# Patient Record
Sex: Male | Born: 1965 | Race: White | Hispanic: No | Marital: Married | Smoking: Never smoker
Health system: Southern US, Community
[De-identification: ages and names within clinical notes are randomized; demographics above are authoritative.]

## PROBLEM LIST (undated history)

## (undated) DIAGNOSIS — K5790 Diverticulosis of intestine, part unspecified, without perforation or abscess without bleeding: Secondary | ICD-10-CM

## (undated) DIAGNOSIS — E669 Obesity, unspecified: Secondary | ICD-10-CM

## (undated) DIAGNOSIS — G4730 Sleep apnea, unspecified: Secondary | ICD-10-CM

## (undated) DIAGNOSIS — Z87442 Personal history of urinary calculi: Secondary | ICD-10-CM

## (undated) DIAGNOSIS — R0600 Dyspnea, unspecified: Secondary | ICD-10-CM

## (undated) DIAGNOSIS — K649 Unspecified hemorrhoids: Secondary | ICD-10-CM

## (undated) DIAGNOSIS — K635 Polyp of colon: Secondary | ICD-10-CM

## (undated) HISTORY — PX: FRACTURE SURGERY: SHX138

---

## 2006-03-17 ENCOUNTER — Ambulatory Visit: Payer: Self-pay | Admitting: Family Medicine

## 2006-03-19 ENCOUNTER — Ambulatory Visit: Payer: Self-pay

## 2012-05-14 ENCOUNTER — Ambulatory Visit: Payer: Self-pay | Admitting: Neurology

## 2013-10-20 ENCOUNTER — Emergency Department: Payer: Self-pay | Admitting: Emergency Medicine

## 2013-10-20 LAB — BASIC METABOLIC PANEL
Anion Gap: 6 — ABNORMAL LOW (ref 7–16)
BUN: 15 mg/dL (ref 7–18)
CALCIUM: 8.8 mg/dL (ref 8.5–10.1)
CHLORIDE: 106 mmol/L (ref 98–107)
CREATININE: 1.24 mg/dL (ref 0.60–1.30)
Co2: 27 mmol/L (ref 21–32)
EGFR (Non-African Amer.): >60
GLUCOSE: 105 mg/dL — AB (ref 65–99)
Osmolality: 279 (ref 275–301)
Potassium: 4.2 mmol/L (ref 3.5–5.1)
Sodium: 139 mmol/L (ref 136–145)

## 2013-10-20 LAB — URINALYSIS, COMPLETE
Bacteria: NONE SEEN
Bilirubin,UR: NEGATIVE
GLUCOSE, UR: NEGATIVE mg/dL (ref 0–75)
KETONE: NEGATIVE
LEUKOCYTE ESTERASE: NEGATIVE
Nitrite: NEGATIVE
PH: 7 (ref 4.5–8.0)
Specific Gravity: 1.024 (ref 1.003–1.030)
Squamous Epithelial: 2
WBC UR: 1 /HPF (ref 0–5)

## 2013-10-20 LAB — CBC
HCT: 46.1 % (ref 40.0–52.0)
HGB: 15.8 g/dL (ref 13.0–18.0)
MCH: 31.7 pg (ref 26.0–34.0)
MCHC: 34.3 g/dL (ref 32.0–36.0)
MCV: 92 fL (ref 80–100)
PLATELETS: 202 10*3/uL (ref 150–440)
RBC: 4.99 10*6/uL (ref 4.40–5.90)
RDW: 13.9 % (ref 11.5–14.5)
WBC: 7.7 10*3/uL (ref 3.8–10.6)

## 2013-11-25 ENCOUNTER — Ambulatory Visit: Payer: Self-pay | Admitting: Obstetrics and Gynecology

## 2013-12-14 ENCOUNTER — Ambulatory Visit: Payer: Self-pay | Admitting: Obstetrics and Gynecology

## 2014-01-25 ENCOUNTER — Ambulatory Visit: Payer: Self-pay

## 2015-08-24 DIAGNOSIS — G4733 Obstructive sleep apnea (adult) (pediatric): Secondary | ICD-10-CM | POA: Insufficient documentation

## 2015-10-12 ENCOUNTER — Other Ambulatory Visit: Payer: Self-pay | Admitting: Family Medicine

## 2015-10-12 DIAGNOSIS — R1310 Dysphagia, unspecified: Secondary | ICD-10-CM

## 2015-10-19 ENCOUNTER — Ambulatory Visit
Admission: RE | Admit: 2015-10-19 | Discharge: 2015-10-19 | Disposition: A | Discharge status: Home / Self Care | Payer: BLUE CROSS/BLUE SHIELD | Source: Ambulatory Visit | Attending: Family Medicine | Admitting: Family Medicine

## 2015-10-19 DIAGNOSIS — K449 Diaphragmatic hernia without obstruction or gangrene: Secondary | ICD-10-CM | POA: Diagnosis not present

## 2015-10-19 DIAGNOSIS — R938 Abnormal findings on diagnostic imaging of other specified body structures: Secondary | ICD-10-CM | POA: Diagnosis not present

## 2015-10-19 DIAGNOSIS — R1310 Dysphagia, unspecified: Secondary | ICD-10-CM | POA: Diagnosis present

## 2016-01-11 ENCOUNTER — Encounter: Payer: Self-pay | Admitting: *Deleted

## 2016-01-12 ENCOUNTER — Ambulatory Visit
Admission: RE | Admit: 2016-01-12 | Discharge: 2016-01-12 | Disposition: A | Discharge status: Home / Self Care | Payer: BLUE CROSS/BLUE SHIELD | Source: Ambulatory Visit | Attending: Gastroenterology | Admitting: Gastroenterology

## 2016-01-12 ENCOUNTER — Ambulatory Visit: Payer: BLUE CROSS/BLUE SHIELD | Admitting: Certified Registered Nurse Anesthetist

## 2016-01-12 ENCOUNTER — Encounter
Admission: RE | Disposition: A | Discharge status: Home / Self Care | Payer: Self-pay | Source: Ambulatory Visit | Attending: Gastroenterology

## 2016-01-12 ENCOUNTER — Encounter: Payer: Self-pay | Admitting: Certified Registered Nurse Anesthetist

## 2016-01-12 DIAGNOSIS — K5730 Diverticulosis of large intestine without perforation or abscess without bleeding: Secondary | ICD-10-CM | POA: Diagnosis not present

## 2016-01-12 DIAGNOSIS — D123 Benign neoplasm of transverse colon: Secondary | ICD-10-CM | POA: Insufficient documentation

## 2016-01-12 DIAGNOSIS — K635 Polyp of colon: Secondary | ICD-10-CM | POA: Insufficient documentation

## 2016-01-12 DIAGNOSIS — Z1211 Encounter for screening for malignant neoplasm of colon: Secondary | ICD-10-CM | POA: Insufficient documentation

## 2016-01-12 DIAGNOSIS — K219 Gastro-esophageal reflux disease without esophagitis: Secondary | ICD-10-CM | POA: Insufficient documentation

## 2016-01-12 HISTORY — DX: Obesity, unspecified: E66.9

## 2016-01-12 HISTORY — PX: COLONOSCOPY WITH PROPOFOL: SHX5780

## 2016-01-12 SURGERY — COLONOSCOPY WITH PROPOFOL
Anesthesia: General

## 2016-01-12 MED ORDER — LIDOCAINE HCL (PF) 1 % IJ SOLN
INTRAMUSCULAR | Status: AC
  Administered 2016-01-12: 0.03 mL via INTRADERMAL
  Filled 2016-01-12: qty 2

## 2016-01-12 MED ORDER — FENTANYL CITRATE (PF) 100 MCG/2ML IJ SOLN
INTRAMUSCULAR | Status: DC | PRN
  Administered 2016-01-12: 50 ug via INTRAVENOUS

## 2016-01-12 MED ORDER — PROPOFOL 10 MG/ML IV BOLUS
INTRAVENOUS | Status: DC | PRN
  Administered 2016-01-12: 60 mg via INTRAVENOUS

## 2016-01-12 MED ORDER — PROPOFOL 500 MG/50ML IV EMUL
INTRAVENOUS | Status: DC | PRN
  Administered 2016-01-12: 160 ug/kg/min via INTRAVENOUS

## 2016-01-12 MED ORDER — SODIUM CHLORIDE 0.9 % IV SOLN
INTRAVENOUS | Status: DC
  Administered 2016-01-12: 1000 mL via INTRAVENOUS

## 2016-01-12 MED ORDER — LIDOCAINE HCL (PF) 1 % IJ SOLN
2.0000 mL | Freq: Once | INTRAMUSCULAR | Status: AC
  Administered 2016-01-12: 0.03 mL via INTRADERMAL

## 2016-01-12 MED ORDER — MIDAZOLAM HCL 5 MG/5ML IJ SOLN
INTRAMUSCULAR | Status: DC | PRN
  Administered 2016-01-12: 1 mg via INTRAVENOUS

## 2016-01-12 MED ORDER — SODIUM CHLORIDE 0.9 % IV SOLN: INTRAVENOUS | Status: DC

## 2016-01-12 NOTE — Anesthesia Preprocedure Evaluation (Signed)
Anesthesia Evaluation  Patient identified by MRN, date of birth, ID band Patient awake    Reviewed: Allergy & Precautions, NPO status , Patient's Chart, lab work & pertinent test results  History of Anesthesia Complications Negative for: history of anesthetic complications  Airway Mallampati: II       Dental   Pulmonary neg pulmonary ROS,           Cardiovascular      Neuro/Psych negative neurological ROS     GI/Hepatic Neg liver ROS, GERD  Medicated and Controlled,  Endo/Other  negative endocrine ROS  Renal/GU negative Renal ROS     Musculoskeletal negative musculoskeletal ROS (+)   Abdominal   Peds  Hematology negative hematology ROS (+)   Anesthesia Other Findings   Reproductive/Obstetrics                             Anesthesia Physical Anesthesia Plan  ASA: II  Anesthesia Plan: General   Post-op Pain Management:    Induction: Intravenous  Airway Management Planned: Nasal Cannula  Additional Equipment:   Intra-op Plan:   Post-operative Plan:   Informed Consent: I have reviewed the patients History and Physical, chart, labs and discussed the procedure including the risks, benefits and alternatives for the proposed anesthesia with the patient or authorized representative who has indicated his/her understanding and acceptance.     Plan Discussed with:   Anesthesia Plan Comments:         Anesthesia Quick Evaluation

## 2016-01-12 NOTE — Anesthesia Postprocedure Evaluation (Signed)
Anesthesia Post Note  Patient: Trevor Jacobson.  Procedure(s) Performed: Procedure(s) (LRB): COLONOSCOPY WITH PROPOFOL (N/A)  Patient location during evaluation: Endoscopy Anesthesia Type: General Level of consciousness: awake and alert Pain management: pain level controlled Vital Signs Assessment: post-procedure vital signs reviewed and stable Respiratory status: spontaneous breathing and respiratory function stable Cardiovascular status: stable Anesthetic complications: no    Last Vitals:  Vitals:   01/12/16 1407 01/12/16 1417  BP: 101/62 109/76  Pulse: 70 69  Resp: 16 18  Temp:      Last Pain:  Vitals:   01/12/16 1346  TempSrc: Tympanic                 Serina Nichter K

## 2016-01-12 NOTE — Transfer of Care (Signed)
Immediate Anesthesia Transfer of Care Note  Patient: Trevor Jacobson.  Procedure(s) Performed: Procedure(s): COLONOSCOPY WITH PROPOFOL (N/A)  Patient Location: PACU and Endoscopy Unit  Anesthesia Type:General  Level of Consciousness: awake, alert  and oriented  Airway & Oxygen Therapy: Patient Spontanous Breathing and Patient connected to nasal cannula oxygen  Post-op Assessment: Report given to RN and Post -op Vital signs reviewed and stable  Post vital signs: Reviewed and stable  Last Vitals:  Vitals:   01/12/16 1220 01/12/16 1346  BP: 128/74   Pulse: 82   Resp: 17   Temp:  36.3 C    Last Pain:  Vitals:   01/12/16 1346  TempSrc: Tympanic         Complications: No apparent anesthesia complications

## 2016-01-12 NOTE — H&P (Signed)
Outpatient short stay form Pre-procedure 01/12/2016 12:34 PM Lollie Sails MD  Primary Physician: Dr. [place] Pink  Reason for visit:  Colonoscopy  History of present illness:  Patient is a 50 year old male presenting today as above. He tolerated his prep well. He takes no aspirin or blood thinning agents. This is his first colonoscopy.    Current Facility-Administered Medications:  .  0.9 %  sodium chloride infusion, , Intravenous, Continuous, Lollie Sails, MD, Last Rate: 20 mL/hr at 01/12/16 1232, 1,000 mL at 01/12/16 1232 .  0.9 %  sodium chloride infusion, , Intravenous, Continuous, Lollie Sails, MD  Prescriptions Prior to Admission  Medication Sig Dispense Refill Last Dose  . omeprazole (PRILOSEC) 20 MG capsule Take 20 mg by mouth daily.        No Known Allergies   Past Medical History:  Diagnosis Date  . Obese     Review of systems:      Physical Exam    Heart and lungs: Regular rate and rhythm without rub or gallop, lungs are bilaterally clear.    HEENT: Normocephalic atraumatic eyes are anicteric    Other:     Pertinant exam for procedure: Protuberant, soft, nontender nondistended bowel sounds positive normoactive.    Planned proceedures: Colonoscopy and indicated procedures.    Lollie Sails, MD Gastroenterology 01/12/2016  12:34 PM

## 2016-01-12 NOTE — Op Note (Signed)
[facility] Gastroenterology Patient Name: Trevor Jacobson Procedure Date: 01/12/2016 12:27 PM MRN: [id number] Account #: [id number] Date of Birth: 07-13-1965 Admit Type: Outpatient Age: 50 Room: [facility] ENDO ROOM 3 Gender: Male Note Status: Finalized Procedure:            Colonoscopy Indications:          Screening for colorectal malignant neoplasm, This is                        the patient's first colonoscopy Providers:            Lollie Sails, MD Referring MD:         Irven Easterly. Kary Kos, MD (Referring MD) Medicines:            Monitored Anesthesia Care Complications:        No immediate complications. Procedure:            Pre-Anesthesia Assessment:                       - ASA Grade Assessment: III - A patient with severe                        systemic disease.                       After obtaining informed consent, the colonoscope was                        passed under direct vision. Throughout the procedure,                        the patient's blood pressure, pulse, and oxygen                        saturations were monitored continuously. The                        Colonoscope was introduced through the anus and                        advanced to the the ileocecal valve. The patient                        tolerated the procedure well. The quality of the bowel                        preparation was good. Findings:      A 2 mm polyp was found in the sigmoid colon. The polyp was sessile. The       polyp was removed with a cold biopsy forceps. Resection and retrieval       were complete.      A 12 mm polyp was found in the hepatic flexure. The polyp was sessile.       The polyp was removed with a cold snare. The polyp was removed with a       lift and cut technique using a cold snare. The polyp was removed with a       piecemeal technique using a cold snare. Resection and retrieval were       complete.      A few small-mouthed diverticula were found in  the sigmoid colon and       descending colon.      Despite multiple efforts including abdominal support and position       change, I was unable to intubate the very base of the cecum. Most of the       cecum was seen from just distal to the IC valve, and noted to be normal.      The retroflexed view of the distal rectum and anal verge was normal and       showed no anal or rectal abnormalities.      The digital rectal exam was normal. Impression:           - One 2 mm polyp in the sigmoid colon, removed with a                        cold biopsy forceps. Resected and retrieved.                       - One 12 mm polyp at the hepatic flexure, removed with                        a cold snare, removed using lift and cut and a cold                        snare and removed piecemeal using a cold snare.                        Resected and retrieved.                       - Diverticulosis in the sigmoid colon and in the                        descending colon. Recommendation:       - Discharge patient to home. Procedure Code(s):    --- Professional ---                       [phone], Colonoscopy, flexible; with removal of tumor(s),                        polyp(s), or other lesion(s) by snare technique                       45380, 40, Colonoscopy, flexible; with biopsy, single                        or multiple Diagnosis Code(s):    --- Professional ---                       Z12.11, Encounter for screening for malignant neoplasm                        of colon                       D12.5, Benign neoplasm of sigmoid colon                       D12.3, Benign neoplasm of transverse colon (hepatic  flexure or splenic flexure)                       K57.30, Diverticulosis of large intestine without                        perforation or abscess without bleeding CPT copyright 2016 American Medical Association. All rights reserved. The codes documented in this report are preliminary and  upon coder review may  be revised to meet current compliance requirements. Lollie Sails, MD 01/12/2016 1:44:06 PM This report has been signed electronically. Number of Addenda: 0 Note Initiated On: 01/12/2016 12:27 PM Scope Withdrawal Time: 0 hours 9 minutes 50 seconds  Total Procedure Duration: 0 hours 49 minutes 25 seconds       [facility]

## 2016-01-12 NOTE — Anesthesia Procedure Notes (Signed)
Date/Time: 01/12/2016 12:43 PM Performed by: Kennon Holter Pre-anesthesia Checklist: Timeout performed, Patient being monitored, Suction available, Emergency Drugs available and Patient identified Patient Re-evaluated:Patient Re-evaluated prior to inductionOxygen Delivery Method: Nasal cannula Preoxygenation: Pre-oxygenation with 100% oxygen Placement Confirmation: positive ETCO2

## 2016-01-15 ENCOUNTER — Encounter: Payer: Self-pay | Admitting: Gastroenterology

## 2016-01-15 LAB — SURGICAL PATHOLOGY

## 2016-03-30 IMAGING — US US RENAL KIDNEY
1 series · 14 of 25 positions shown · non-contrast
Comparison: None.

CLINICAL DATA: 48-year-old male with right flank pain. Recent
kidney stones. Initial encounter.

EXAM:
RENAL/URINARY TRACT ULTRASOUND COMPLETE

[Series 1: us renal kidney · 0.27mm/px · 14 of 46 slices shown]
[im 1/46]
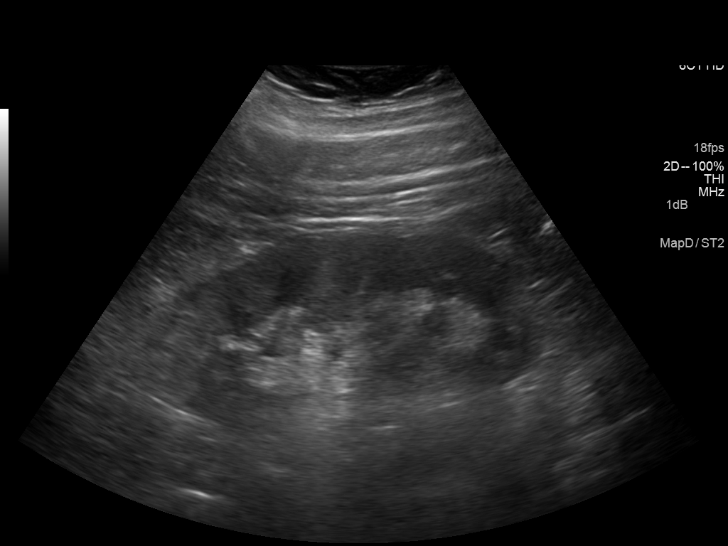
[im 4/46]
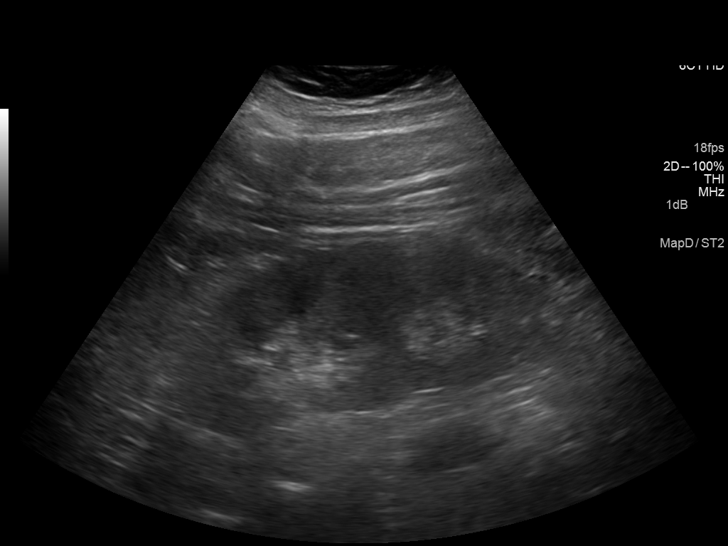
[im 8/46]
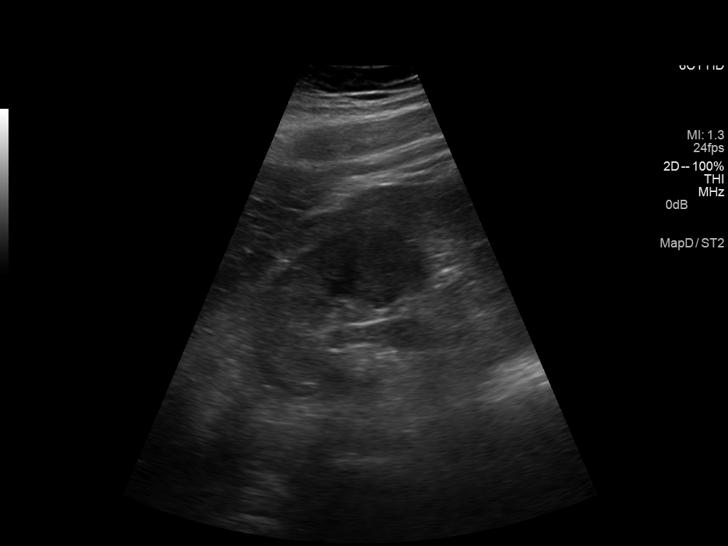
[im 12/46]
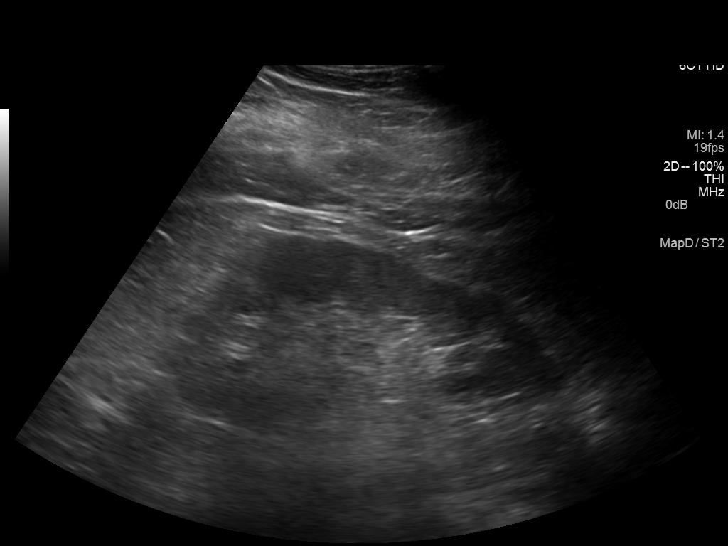
[im 16/46]
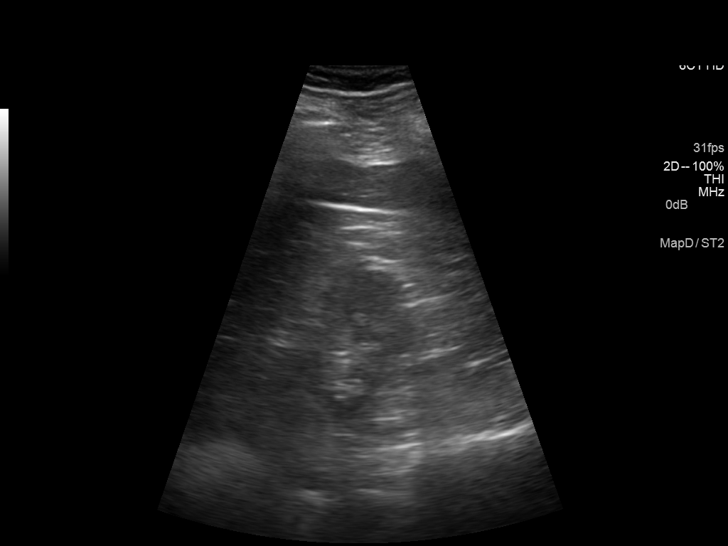
[im 17/46]
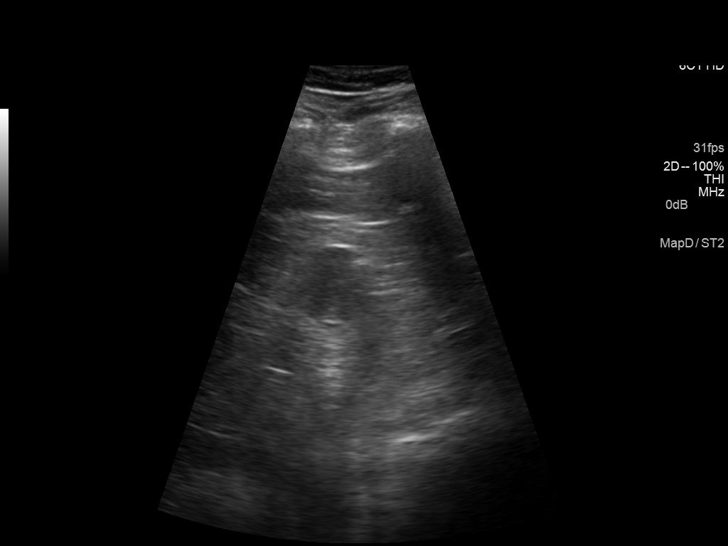
[im 21/46]
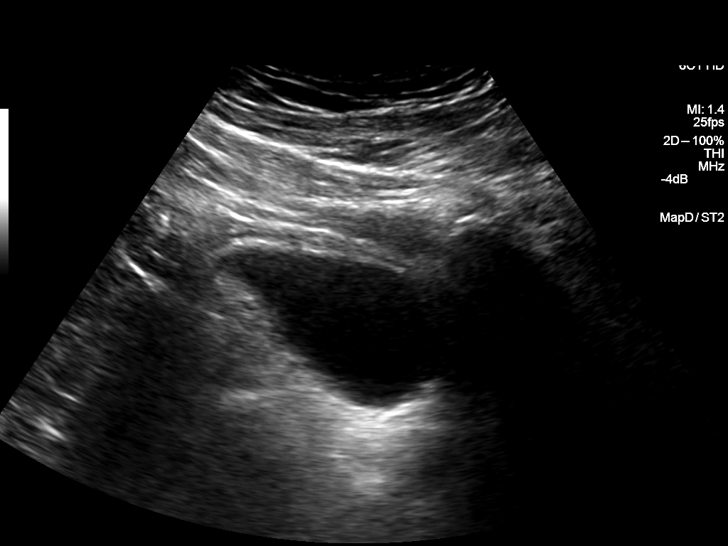
[im 25/46]
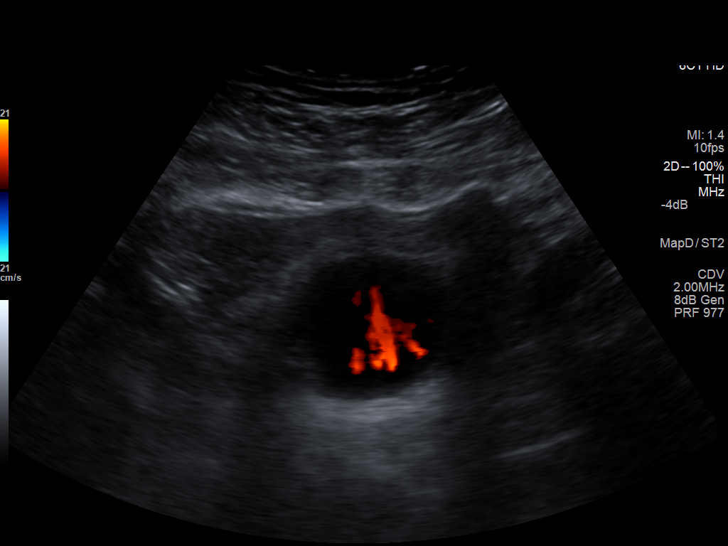
[im 29/46]
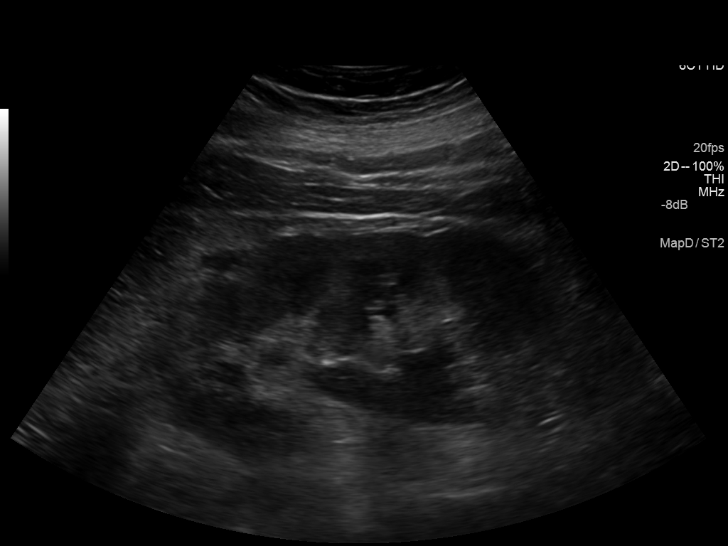
[im 31/46]
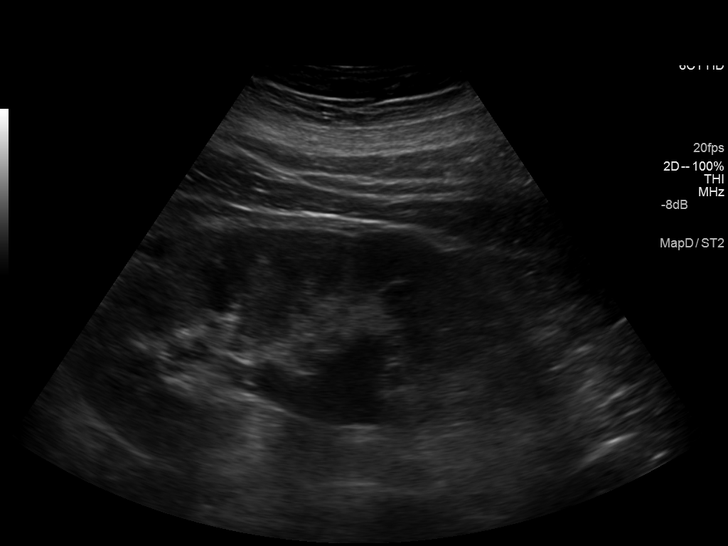
[im 34/46]
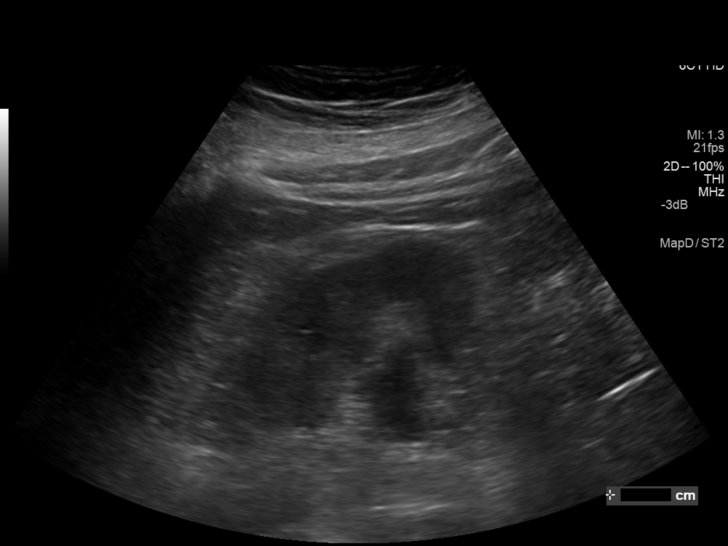
[im 38/46]
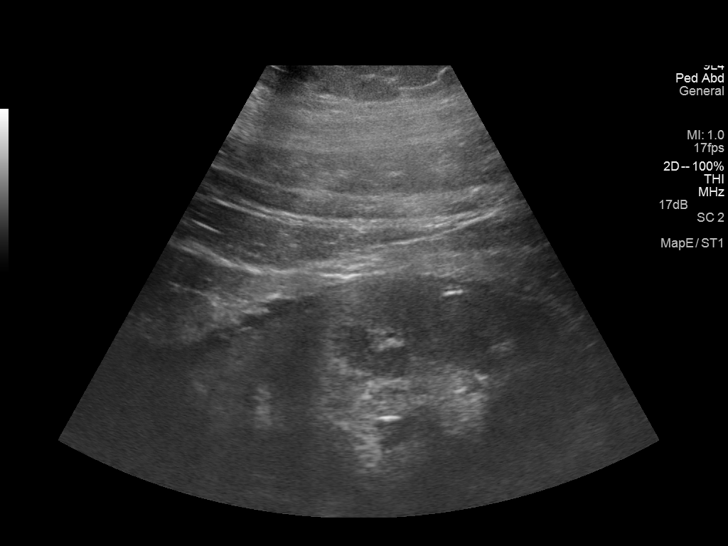
[im 42/46]
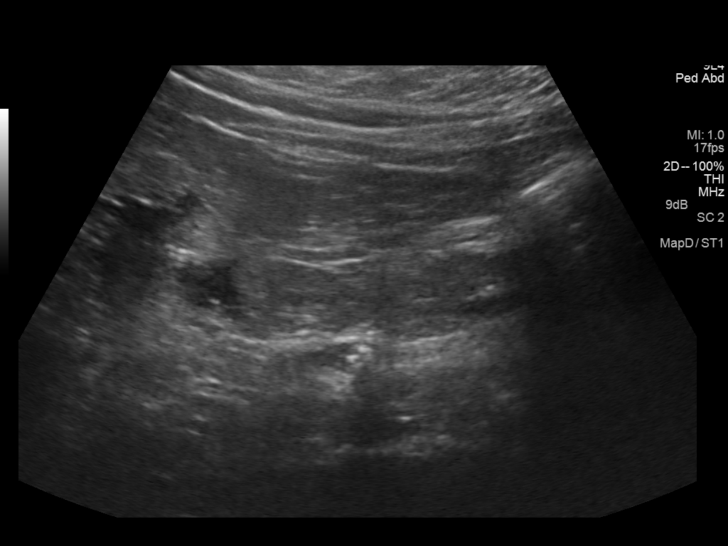
[im 46/46]
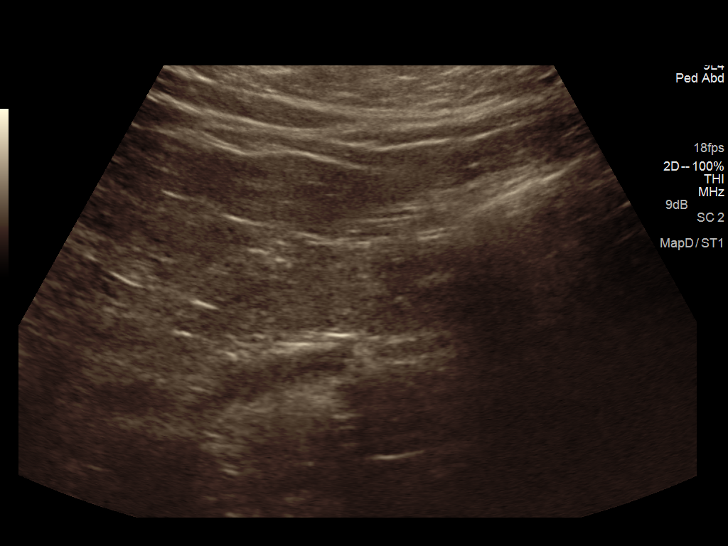

[14 of 25 positions shown; findings below may reference images not displayed]

FINDINGS: Right Kidney:

Length: 15.0 cm. Trace para renal free fluid (image 28). There is
enlargement of the right renal pelvis, less enlargement of the intra
renal collecting system (image 32). The right ureter can be followed
to an intermediate echogenicity lesion seen on image 42 measuring 5
x 6 mm. Renal corticomedullary differentiation preserved. No intra
renal lesion identified.

Left Kidney:

Length: 13.1 cm. Echogenicity within normal limits. No mass or
hydronephrosis visualized.

Bladder:

Diminutive, appears normal. Both ureteral jets are detected with
Doppler, although the left appears more robust (image 26).
IMPRESSION: 1. Suspect acute obstructive uropathy on the right with evidence of
a 5 x 6 mm proximal right ureteral calculus (image 42).
2. Negative sonographic appearance of the left kidney and bladder.

## 2016-09-19 DIAGNOSIS — Z0000 Encounter for general adult medical examination without abnormal findings: Secondary | ICD-10-CM | POA: Diagnosis not present

## 2016-09-19 DIAGNOSIS — G4733 Obstructive sleep apnea (adult) (pediatric): Secondary | ICD-10-CM | POA: Diagnosis not present

## 2016-09-19 DIAGNOSIS — Z7689 Persons encountering health services in other specified circumstances: Secondary | ICD-10-CM | POA: Diagnosis not present

## 2016-09-19 DIAGNOSIS — Z125 Encounter for screening for malignant neoplasm of prostate: Secondary | ICD-10-CM | POA: Diagnosis not present

## 2016-12-11 DIAGNOSIS — R079 Chest pain, unspecified: Secondary | ICD-10-CM | POA: Diagnosis not present

## 2016-12-11 DIAGNOSIS — R0602 Shortness of breath: Secondary | ICD-10-CM | POA: Insufficient documentation

## 2016-12-11 DIAGNOSIS — R42 Dizziness and giddiness: Secondary | ICD-10-CM | POA: Diagnosis not present

## 2016-12-18 DIAGNOSIS — R002 Palpitations: Secondary | ICD-10-CM | POA: Diagnosis not present

## 2016-12-26 DIAGNOSIS — H018 Other specified inflammations of eyelid: Secondary | ICD-10-CM | POA: Diagnosis not present

## 2016-12-28 DIAGNOSIS — R0602 Shortness of breath: Secondary | ICD-10-CM | POA: Diagnosis not present

## 2016-12-28 DIAGNOSIS — G4733 Obstructive sleep apnea (adult) (pediatric): Secondary | ICD-10-CM | POA: Diagnosis not present

## 2016-12-29 DIAGNOSIS — R0602 Shortness of breath: Secondary | ICD-10-CM | POA: Diagnosis not present

## 2016-12-29 DIAGNOSIS — G4733 Obstructive sleep apnea (adult) (pediatric): Secondary | ICD-10-CM | POA: Diagnosis not present

## 2017-01-09 DIAGNOSIS — R079 Chest pain, unspecified: Secondary | ICD-10-CM | POA: Diagnosis not present

## 2017-01-09 DIAGNOSIS — R0602 Shortness of breath: Secondary | ICD-10-CM | POA: Diagnosis not present

## 2017-01-14 DIAGNOSIS — R079 Chest pain, unspecified: Secondary | ICD-10-CM | POA: Diagnosis not present

## 2017-01-14 DIAGNOSIS — G4733 Obstructive sleep apnea (adult) (pediatric): Secondary | ICD-10-CM | POA: Diagnosis not present

## 2017-01-14 DIAGNOSIS — R0602 Shortness of breath: Secondary | ICD-10-CM | POA: Diagnosis not present

## 2017-01-16 DIAGNOSIS — G4733 Obstructive sleep apnea (adult) (pediatric): Secondary | ICD-10-CM | POA: Diagnosis not present

## 2017-02-15 DIAGNOSIS — G4733 Obstructive sleep apnea (adult) (pediatric): Secondary | ICD-10-CM | POA: Diagnosis not present

## 2017-03-18 DIAGNOSIS — G4733 Obstructive sleep apnea (adult) (pediatric): Secondary | ICD-10-CM | POA: Diagnosis not present

## 2017-03-19 DIAGNOSIS — G4733 Obstructive sleep apnea (adult) (pediatric): Secondary | ICD-10-CM | POA: Diagnosis not present

## 2017-03-22 ENCOUNTER — Emergency Department
Admission: EM | Admit: 2017-03-22 | Discharge: 2017-03-22 | Disposition: A | Discharge status: Home / Self Care | Payer: 59 | Attending: Emergency Medicine | Admitting: Emergency Medicine

## 2017-03-22 ENCOUNTER — Encounter: Payer: Self-pay | Admitting: Emergency Medicine

## 2017-03-22 ENCOUNTER — Other Ambulatory Visit: Payer: Self-pay

## 2017-03-22 DIAGNOSIS — S61411A Laceration without foreign body of right hand, initial encounter: Secondary | ICD-10-CM

## 2017-03-22 DIAGNOSIS — Y929 Unspecified place or not applicable: Secondary | ICD-10-CM | POA: Insufficient documentation

## 2017-03-22 DIAGNOSIS — Y939 Activity, unspecified: Secondary | ICD-10-CM | POA: Diagnosis not present

## 2017-03-22 DIAGNOSIS — Z23 Encounter for immunization: Secondary | ICD-10-CM | POA: Diagnosis not present

## 2017-03-22 DIAGNOSIS — Z79899 Other long term (current) drug therapy: Secondary | ICD-10-CM | POA: Insufficient documentation

## 2017-03-22 DIAGNOSIS — W268XXA Contact with other sharp object(s), not elsewhere classified, initial encounter: Secondary | ICD-10-CM | POA: Insufficient documentation

## 2017-03-22 DIAGNOSIS — Y998 Other external cause status: Secondary | ICD-10-CM | POA: Insufficient documentation

## 2017-03-22 MED ORDER — TETANUS-DIPHTH-ACELL PERTUSSIS 5-2.5-18.5 LF-MCG/0.5 IM SUSP
0.5000 mL | Freq: Once | INTRAMUSCULAR | Status: AC
  Administered 2017-03-22: 0.5 mL via INTRAMUSCULAR
  Filled 2017-03-22: qty 0.5

## 2017-03-22 NOTE — ED Triage Notes (Signed)
Pt cut between 4th and 5th digit with box cutter.  Bleeding controlled. Tdap not UTD.

## 2017-03-22 NOTE — ED Notes (Signed)
See triage note  States he was using a box cutter today while trying floor    The box cutter slipped and cut hand  Laceration noted between left 4th and 5 th fingers  Bleeding controlled at present

## 2017-03-22 NOTE — ED Provider Notes (Signed)
[facility] Emergency Department Provider Note  ____________________________________________  Time seen: Approximately 1:23 PM  I have reviewed the triage vital signs and the nursing notes.   HISTORY  Chief Complaint Laceration    HPI Trevor Jacobson. is a 51 y.o. male who presents the emergency department complaining of a laceration between the fourth and fifth digits of the right hand.  Patient was trying to open something with a box cutter when the blade slipped and he punctured between the interdigital space.  Patient denies any loss of range of motion.  Bleeding was easily controlled with direct pressure.  No other injury or complaint.  Patient's tetanus shot is not up-to-date.  Past Medical History:  Diagnosis Date  . Obese     There are no active problems to display for this patient.   Past Surgical History:  Procedure Laterality Date  . COLONOSCOPY WITH PROPOFOL N/A 01/12/2016   Procedure: COLONOSCOPY WITH PROPOFOL;  Surgeon: Lollie Sails, MD;  Location: [facility] ENDOSCOPY;  Service: Endoscopy;  Laterality: N/A;    Prior to Admission medications   Medication Sig Start Date End Date Taking? Authorizing Provider  omeprazole (PRILOSEC) 20 MG capsule Take 20 mg by mouth daily.    [provider]    Allergies Patient has no known allergies.  History reviewed. No pertinent family history.  Social History Social History   Tobacco Use  . Smoking status: Never Smoker  . Smokeless tobacco: Never Used  Substance Use Topics  . Alcohol use: Yes  . Drug use: No     Review of Systems  Constitutional: No fever/chills Eyes: No visual changes.  Respiratory: no cough. No SOB. Gastrointestinal: No abdominal pain.  No nausea, no vomiting.   Musculoskeletal: Negative for musculoskeletal pain. Skin: Positive for laceration between the fourth and fifth digits of the right hand. Neurological: Negative for headaches, focal weakness or  numbness. 10-point ROS otherwise negative.  ____________________________________________   PHYSICAL EXAM:  VITAL SIGNS: ED Triage Vitals [03/22/17 1304]  Enc Vitals Group     BP 136/85     Pulse Rate 82     Resp 16     Temp 98.4 F (36.9 C)     Temp Source Oral     SpO2 98 %     Weight 279 lb (126.6 kg)     Height 5\' 10"  (1.778 m)     Head Circumference      Peak Flow      Pain Score 4     Pain Loc      Pain Edu?      Excl. in [place]?      Constitutional: Alert and oriented. Well appearing and in no acute distress. Eyes: Conjunctivae are normal. PERRL. EOMI. Head: Atraumatic. Neck: No stridor.    Cardiovascular: Normal rate, regular rhythm. Normal S1 and S2.  Good peripheral circulation. Respiratory: Normal respiratory effort without tachypnea or retractions. Lungs CTAB. Good air entry to the bases with no decreased or absent breath sounds. Musculoskeletal: Full range of motion to all extremities. No gross deformities appreciated. Neurologic:  Normal speech and language. No gross focal neurologic deficits are appreciated.  Skin:  Skin is warm, dry and intact. No rash noted.  Small 0.5 cm puncture wound noted between the fourth and fifth digits of the right hand.  Edges are smooth in nature.  No foreign body.  No bleeding.  Full range of motion to fourth and fifth digits of the right hand.  Sensation and cap refill intact to the fourth and fifth digits. Psychiatric: Mood and affect are normal. Speech and behavior are normal. Patient exhibits appropriate insight and judgement.   ____________________________________________   LABS (all labs ordered are listed, but only abnormal results are displayed)  Labs Reviewed - No data to display ____________________________________________  EKG   ____________________________________________  RADIOLOGY   No results found.  ____________________________________________    PROCEDURES  Procedure(s) performed:     Marland KitchenMarland KitchenLaceration Repair Date/Time: 03/22/2017 1:47 PM Performed by: Darletta Moll, PA-C Authorized by: Darletta Moll, PA-C   Consent:    Consent obtained:  Verbal   Consent given by:  Patient   Risks discussed:  Pain and poor wound healing Anesthesia (see MAR for exact dosages):    Anesthesia method:  None Laceration details:    Location:  Hand   Hand location: Interdigital space between the fourth and fifth digits.   Length (cm):  0.5 Repair type:    Repair type:  Simple Exploration:    Hemostasis achieved with:  Direct pressure   Wound exploration: wound explored through full range of motion and entire depth of wound probed and visualized     Wound extent: no foreign bodies/material noted, no muscle damage noted, no nerve damage noted, no tendon damage noted, no underlying fracture noted and no vascular damage noted     Contaminated: no   Treatment:    Area cleansed with:  Betadine   Amount of cleaning:  Extensive Skin repair:    Repair method:  Tissue adhesive Approximation:    Approximation:  Close Post-procedure details:    Dressing:  Splint for protection   Patient tolerance of procedure:  Tolerated well, no immediate complications      Medications  Tdap (BOOSTRIX) injection 0.5 mL (not administered)     ____________________________________________   INITIAL IMPRESSION / ASSESSMENT AND PLAN / ED COURSE  Pertinent labs & imaging results that were available during my care of the patient were reviewed by me and considered in my medical decision making (see chart for details).  Review of the [place] CSRS was performed in accordance of the [place] prior to dispensing any controlled drugs.     Patient's diagnosis is consistent with an laceration.  Patient had puncture wound between the fourth and fifth digits in the interdigital space.  Area sealed using Dermabond.  Splint applied for protection.  Tetanus shot updated at this time.  No prescriptions at  this time.  Patient will follow up with primary care as needed..Patient is given ED precautions to return to the ED for any worsening or new symptoms.     ____________________________________________  FINAL CLINICAL IMPRESSION(S) / ED DIAGNOSES  Final diagnoses:  Laceration of right hand without foreign body, initial encounter      NEW MEDICATIONS STARTED DURING THIS VISIT:  ED Discharge Orders    None          This chart was dictated using voice recognition software/Dragon. Despite best efforts to proofread, errors can occur which can change the meaning. Any change was purely unintentional.    Darletta Moll, PA-C 03/22/17 1410    Arta Silence, MD 03/22/17 1534

## 2017-04-17 DIAGNOSIS — G4733 Obstructive sleep apnea (adult) (pediatric): Secondary | ICD-10-CM | POA: Diagnosis not present

## 2017-05-18 DIAGNOSIS — G4733 Obstructive sleep apnea (adult) (pediatric): Secondary | ICD-10-CM | POA: Diagnosis not present

## 2017-06-18 DIAGNOSIS — G4733 Obstructive sleep apnea (adult) (pediatric): Secondary | ICD-10-CM | POA: Diagnosis not present

## 2017-07-14 DIAGNOSIS — R0602 Shortness of breath: Secondary | ICD-10-CM | POA: Diagnosis not present

## 2017-07-14 DIAGNOSIS — G4733 Obstructive sleep apnea (adult) (pediatric): Secondary | ICD-10-CM | POA: Diagnosis not present

## 2017-07-14 DIAGNOSIS — R079 Chest pain, unspecified: Secondary | ICD-10-CM | POA: Diagnosis not present

## 2017-07-16 DIAGNOSIS — G4733 Obstructive sleep apnea (adult) (pediatric): Secondary | ICD-10-CM | POA: Diagnosis not present

## 2017-08-16 DIAGNOSIS — G4733 Obstructive sleep apnea (adult) (pediatric): Secondary | ICD-10-CM | POA: Diagnosis not present

## 2017-09-15 DIAGNOSIS — G4733 Obstructive sleep apnea (adult) (pediatric): Secondary | ICD-10-CM | POA: Diagnosis not present

## 2017-09-16 DIAGNOSIS — Z0000 Encounter for general adult medical examination without abnormal findings: Secondary | ICD-10-CM | POA: Diagnosis not present

## 2017-09-16 DIAGNOSIS — Z125 Encounter for screening for malignant neoplasm of prostate: Secondary | ICD-10-CM | POA: Diagnosis not present

## 2017-09-22 DIAGNOSIS — Z0000 Encounter for general adult medical examination without abnormal findings: Secondary | ICD-10-CM | POA: Diagnosis not present

## 2017-09-22 DIAGNOSIS — G4733 Obstructive sleep apnea (adult) (pediatric): Secondary | ICD-10-CM | POA: Diagnosis not present

## 2017-09-22 DIAGNOSIS — Z9989 Dependence on other enabling machines and devices: Secondary | ICD-10-CM | POA: Diagnosis not present

## 2017-10-16 DIAGNOSIS — G4733 Obstructive sleep apnea (adult) (pediatric): Secondary | ICD-10-CM | POA: Diagnosis not present

## 2018-01-15 DIAGNOSIS — G4733 Obstructive sleep apnea (adult) (pediatric): Secondary | ICD-10-CM | POA: Diagnosis not present

## 2018-01-15 DIAGNOSIS — R0602 Shortness of breath: Secondary | ICD-10-CM | POA: Diagnosis not present

## 2018-01-15 DIAGNOSIS — R079 Chest pain, unspecified: Secondary | ICD-10-CM | POA: Diagnosis not present

## 2018-07-03 DIAGNOSIS — J101 Influenza due to other identified influenza virus with other respiratory manifestations: Secondary | ICD-10-CM | POA: Diagnosis not present

## 2018-07-03 DIAGNOSIS — R6889 Other general symptoms and signs: Secondary | ICD-10-CM | POA: Diagnosis not present

## 2019-01-14 ENCOUNTER — Other Ambulatory Visit: Payer: Self-pay

## 2019-01-14 ENCOUNTER — Other Ambulatory Visit
Admission: RE | Admit: 2019-01-14 | Discharge: 2019-01-14 | Disposition: A | Discharge status: Home / Self Care | Payer: 59 | Source: Ambulatory Visit | Attending: Gastroenterology | Admitting: Gastroenterology

## 2019-01-14 DIAGNOSIS — Z20828 Contact with and (suspected) exposure to other viral communicable diseases: Secondary | ICD-10-CM | POA: Diagnosis not present

## 2019-01-14 DIAGNOSIS — Z01812 Encounter for preprocedural laboratory examination: Secondary | ICD-10-CM | POA: Diagnosis present

## 2019-01-15 ENCOUNTER — Encounter: Payer: Self-pay | Admitting: *Deleted

## 2019-01-15 LAB — SARS CORONAVIRUS 2 (TAT 6-24 HRS): SARS Coronavirus 2: NEGATIVE

## 2019-01-18 ENCOUNTER — Encounter: Payer: Self-pay | Admitting: *Deleted

## 2019-01-18 ENCOUNTER — Encounter
Admission: RE | Disposition: A | Discharge status: Home / Self Care | Payer: Self-pay | Source: Home / Self Care | Attending: Gastroenterology

## 2019-01-18 ENCOUNTER — Ambulatory Visit: Payer: 59 | Admitting: Anesthesiology

## 2019-01-18 ENCOUNTER — Other Ambulatory Visit: Payer: Self-pay

## 2019-01-18 ENCOUNTER — Ambulatory Visit
Admission: RE | Admit: 2019-01-18 | Discharge: 2019-01-18 | Disposition: A | Discharge status: Home / Self Care | Payer: 59 | Attending: Gastroenterology | Admitting: Gastroenterology

## 2019-01-18 DIAGNOSIS — Z1211 Encounter for screening for malignant neoplasm of colon: Secondary | ICD-10-CM | POA: Diagnosis not present

## 2019-01-18 DIAGNOSIS — K621 Rectal polyp: Secondary | ICD-10-CM | POA: Diagnosis not present

## 2019-01-18 DIAGNOSIS — G4730 Sleep apnea, unspecified: Secondary | ICD-10-CM | POA: Insufficient documentation

## 2019-01-18 DIAGNOSIS — Z79899 Other long term (current) drug therapy: Secondary | ICD-10-CM | POA: Insufficient documentation

## 2019-01-18 DIAGNOSIS — Z6841 Body Mass Index (BMI) 40.0 and over, adult: Secondary | ICD-10-CM | POA: Diagnosis not present

## 2019-01-18 DIAGNOSIS — Z87442 Personal history of urinary calculi: Secondary | ICD-10-CM | POA: Diagnosis not present

## 2019-01-18 DIAGNOSIS — K5730 Diverticulosis of large intestine without perforation or abscess without bleeding: Secondary | ICD-10-CM | POA: Diagnosis not present

## 2019-01-18 DIAGNOSIS — Z86010 Personal history of colonic polyps: Secondary | ICD-10-CM | POA: Diagnosis present

## 2019-01-18 DIAGNOSIS — K635 Polyp of colon: Secondary | ICD-10-CM | POA: Insufficient documentation

## 2019-01-18 DIAGNOSIS — K219 Gastro-esophageal reflux disease without esophagitis: Secondary | ICD-10-CM | POA: Insufficient documentation

## 2019-01-18 HISTORY — DX: Unspecified hemorrhoids: K64.9

## 2019-01-18 HISTORY — DX: Personal history of urinary calculi: Z87.442

## 2019-01-18 HISTORY — PX: COLONOSCOPY WITH PROPOFOL: SHX5780

## 2019-01-18 HISTORY — DX: Sleep apnea, unspecified: G47.30

## 2019-01-18 HISTORY — DX: Dyspnea, unspecified: R06.00

## 2019-01-18 HISTORY — DX: Diverticulosis of intestine, part unspecified, without perforation or abscess without bleeding: K57.90

## 2019-01-18 HISTORY — DX: Polyp of colon: K63.5

## 2019-01-18 SURGERY — COLONOSCOPY WITH PROPOFOL
Anesthesia: General

## 2019-01-18 MED ORDER — PHENYLEPHRINE HCL (PRESSORS) 10 MG/ML IV SOLN
INTRAVENOUS | Status: AC
  Filled 2019-01-18: qty 1

## 2019-01-18 MED ORDER — PROPOFOL 10 MG/ML IV BOLUS
INTRAVENOUS | Status: DC | PRN
  Administered 2019-01-18: 30 mg via INTRAVENOUS
  Administered 2019-01-18: 40 mg via INTRAVENOUS
  Administered 2019-01-18 (×3): 30 mg via INTRAVENOUS

## 2019-01-18 MED ORDER — PROPOFOL 10 MG/ML IV BOLUS
INTRAVENOUS | Status: AC
  Filled 2019-01-18: qty 20

## 2019-01-18 MED ORDER — EPHEDRINE SULFATE 50 MG/ML IJ SOLN
INTRAMUSCULAR | Status: DC | PRN
  Administered 2019-01-18: 10 mg via INTRAVENOUS

## 2019-01-18 MED ORDER — PHENYLEPHRINE HCL (PRESSORS) 10 MG/ML IV SOLN
INTRAVENOUS | Status: DC | PRN
  Administered 2019-01-18: 100 ug via INTRAVENOUS

## 2019-01-18 MED ORDER — LIDOCAINE HCL (PF) 2 % IJ SOLN
INTRAMUSCULAR | Status: AC
  Filled 2019-01-18: qty 10

## 2019-01-18 MED ORDER — LIDOCAINE HCL (CARDIAC) PF 100 MG/5ML IV SOSY
PREFILLED_SYRINGE | INTRAVENOUS | Status: DC | PRN
  Administered 2019-01-18: 60 mg via INTRAVENOUS

## 2019-01-18 MED ORDER — PROPOFOL 500 MG/50ML IV EMUL
INTRAVENOUS | Status: DC | PRN
  Administered 2019-01-18: 100 ug/kg/min via INTRAVENOUS

## 2019-01-18 MED ORDER — SODIUM CHLORIDE 0.9 % IV SOLN
INTRAVENOUS | Status: DC
  Administered 2019-01-18: 07:00:00 via INTRAVENOUS

## 2019-01-18 MED ORDER — EPHEDRINE SULFATE 50 MG/ML IJ SOLN
INTRAMUSCULAR | Status: AC
  Filled 2019-01-18: qty 1

## 2019-01-18 MED ORDER — SODIUM CHLORIDE (PF) 0.9 % IJ SOLN
INTRAMUSCULAR | Status: AC
  Filled 2019-01-18: qty 10

## 2019-01-18 MED ORDER — PROPOFOL 500 MG/50ML IV EMUL
INTRAVENOUS | Status: AC
  Filled 2019-01-18: qty 50

## 2019-01-18 NOTE — Anesthesia Postprocedure Evaluation (Signed)
Anesthesia Post Note  Patient: Trevor Jacobson.  Procedure(s) Performed: COLONOSCOPY WITH PROPOFOL (N/A )  Patient location during evaluation: Endoscopy Anesthesia Type: General Level of consciousness: awake and alert Pain management: pain level controlled Vital Signs Assessment: post-procedure vital signs reviewed and stable Respiratory status: spontaneous breathing and respiratory function stable Cardiovascular status: stable Anesthetic complications: no     Last Vitals:  Vitals:   01/18/19 0840 01/18/19 0843  BP: 122/77 122/77  Pulse: 83 82  Resp: 16 15  Temp: (!) 35.8 C   SpO2: 98% 97%    Last Pain:  Vitals:   01/18/19 0840  TempSrc: Tympanic                 KEPHART,WILLIAM K

## 2019-01-18 NOTE — Anesthesia Preprocedure Evaluation (Signed)
Anesthesia Evaluation  Patient identified by MRN, date of birth, ID band Patient awake    Reviewed: Allergy & Precautions, NPO status , Patient's Chart, lab work & pertinent test results  History of Anesthesia Complications Negative for: history of anesthetic complications  Airway Mallampati: III       Dental   Pulmonary sleep apnea and Continuous Positive Airway Pressure Ventilation , neg COPD, Not current smoker,           Cardiovascular (-) hypertension(-) Past MI and (-) CHF (-) dysrhythmias (-) Valvular Problems/Murmurs     Neuro/Psych neg Seizures    GI/Hepatic Neg liver ROS, GERD  Medicated and Controlled,  Endo/Other  neg diabetesMorbid obesity  Renal/GU negative Renal ROS     Musculoskeletal   Abdominal   Peds  Hematology   Anesthesia Other Findings   Reproductive/Obstetrics                             Anesthesia Physical Anesthesia Plan  ASA: III  Anesthesia Plan: General   Post-op Pain Management:    Induction: Intravenous  PONV Risk Score and Plan: 2 and Propofol infusion and TIVA  Airway Management Planned: Nasal Cannula  Additional Equipment:   Intra-op Plan:   Post-operative Plan:   Informed Consent: I have reviewed the patients History and Physical, chart, labs and discussed the procedure including the risks, benefits and alternatives for the proposed anesthesia with the patient or authorized representative who has indicated his/her understanding and acceptance.       Plan Discussed with:   Anesthesia Plan Comments:         Anesthesia Quick Evaluation

## 2019-01-18 NOTE — H&P (Signed)
Outpatient short stay form Pre-procedure 01/18/2019 7:44 AM Trevor Sails MD  Primary Physician: Dr. [place] Pink  Reason for visit: Colonoscopy  History of present illness: Patient is a 53 year old male presenting today for colonoscopy in regards to his personal history of adenomatous colon polyp.  His last colonoscopy was 01/12/2016 with a 12 mm polyp being removed from the hepatic flexure.  He tolerated his prep well.  He takes no aspirin or blood thinning agent.  Patient had a episode of chest pain over a year ago.  This is not been repeated.  He is not needed to take any anti-anginal agent.    Current Facility-Administered Medications:  .  0.9 %  sodium chloride infusion, , Intravenous, Continuous, Trevor Sails, MD, Last Rate: 20 mL/hr at 01/18/19 [id number]  Medications Prior to Admission  Medication Sig Dispense Refill Last Dose  . omeprazole (PRILOSEC) 20 MG capsule Take 20 mg by mouth daily.   Past Week at Unknown time     No Known Allergies   Past Medical History:  Diagnosis Date  . Colon polyps   . Diverticulosis   . Dyspnea   . Hemorrhoids   . History of kidney stones   . Obese   . Sleep apnea     Review of systems:      Physical Exam    Heart and lungs: Regular rate and rhythm without rub or gallop lungs are bilaterally clear    HEENT: Normocephalic atraumatic eyes are anicteric    Other:    Pertinant exam for procedure: Soft nontender nondistended bowel sounds positive normoactive, protuberant.    Planned proceedures: Colonoscopy and indicated procedures. I have discussed the risks benefits and complications of procedures to include not limited to bleeding, infection, perforation and the risk of sedation and the patient wishes to proceed.    Trevor Sails, MD Gastroenterology 01/18/2019  7:44 AM

## 2019-01-18 NOTE — Anesthesia Post-op Follow-up Note (Signed)
Anesthesia QCDR form completed.        

## 2019-01-18 NOTE — Op Note (Signed)
[facility] Gastroenterology Patient Name: Trevor Jacobson Procedure Date: 01/18/2019 7:42 AM MRN: [id number] Account #: [id number] Date of Birth: [birth year] Admit Type: Outpatient Age: 53 Room: [facility] ENDO ROOM 3 Gender: Male Note Status: Finalized Procedure:            Colonoscopy Indications:          Personal history of colonic polyps Providers:            Lollie Sails, MD Referring MD:         Irven Easterly. Kary Kos, MD (Referring MD) Medicines:            Monitored Anesthesia Care Complications:        No immediate complications. Procedure:            Pre-Anesthesia Assessment:                       - ASA Grade Assessment: II - A patient with mild                        systemic disease.                       After obtaining informed consent, the colonoscope was                        passed under direct vision. Throughout the procedure,                        the patient's blood pressure, pulse, and oxygen                        saturations were monitored continuously. The                        Colonoscope was introduced through the anus and                        advanced to the the cecum, identified by appendiceal                        orifice and ileocecal valve. The colonoscopy was                        performed without difficulty. The patient tolerated the                        procedure well. The quality of the bowel preparation                        was good. Findings:      A 4 mm polyp was found in the proximal sigmoid colon. The polyp was       sessile. The polyp was removed with a cold biopsy forceps. Resection and       retrieval were complete.      A 4 mm polyp was found in the recto-sigmoid colon. The polyp was       sessile. The polyp was removed with a piecemeal technique using a cold       biopsy forceps. Resection and retrieval were complete.      A 1 mm polyp was found in the rectum. The  polyp was sessile. The polyp       was removed  with a cold biopsy forceps. Resection and retrieval were       complete.      A few small-mouthed diverticula were found in the transverse colon.      The digital rectal exam was normal.      The retroflexed view of the distal rectum and anal verge was normal and       showed no anal or rectal abnormalities. Impression:           - One 4 mm polyp in the proximal sigmoid colon, removed                        with a cold biopsy forceps. Resected and retrieved.                       - One 4 mm polyp at the recto-sigmoid colon, removed                        piecemeal using a cold biopsy forceps. Resected and                        retrieved.                       - One 1 mm polyp in the rectum, removed with a cold                        biopsy forceps. Resected and retrieved.                       - Diverticulosis in the transverse colon.                       - The distal rectum and anal verge are normal on                        retroflexion view. Recommendation:       - Discharge patient to home.                       - Telephone GI clinic for pathology results in 5 days. Procedure Code(s):    --- Professional ---                       [phone], Colonoscopy, flexible; with biopsy, single or                        multiple Diagnosis Code(s):    --- Professional ---                       K63.5, Polyp of colon                       K62.1, Rectal polyp                       Z86.010, Personal history of colonic polyps                       K57.30, Diverticulosis of large intestine without  perforation or abscess without bleeding CPT copyright 2019 [employer]. All rights reserved. The codes documented in this report are preliminary and upon coder review may  be revised to meet current compliance requirements. Lollie Sails, MD 01/18/2019 8:40:07 AM This report has been signed electronically. Number of Addenda: 0 Note Initiated On: 01/18/2019 7:42  AM Scope Withdrawal Time: 0 hours 15 minutes 35 seconds  Total Procedure Duration: 0 hours 38 minutes 34 seconds       [facility]

## 2019-01-18 NOTE — Transfer of Care (Signed)
Immediate Anesthesia Transfer of Care Note  Patient: Trevor Jacobson.  Procedure(s) Performed: COLONOSCOPY WITH PROPOFOL (N/A )  Patient Location: PACU  Anesthesia Type:General  Level of Consciousness: awake  Airway & Oxygen Therapy: Patient Spontanous Breathing and Patient connected to nasal cannula oxygen  Post-op Assessment: Report given to RN and Post -op Vital signs reviewed and stable  Post vital signs: stable  Last Vitals:  Vitals Value Taken Time  BP 122/77 01/18/19 0843  Temp    Pulse 82 01/18/19 0842  Resp 15 01/18/19 0842  SpO2 97 % 01/18/19 0842  Vitals shown include unvalidated device data.  Last Pain:  Vitals:   01/18/19 0700  TempSrc: Tympanic         Complications: No apparent anesthesia complications

## 2019-01-19 LAB — SURGICAL PATHOLOGY

## 2019-01-20 ENCOUNTER — Encounter: Payer: Self-pay | Admitting: Gastroenterology

## 2020-04-12 DIAGNOSIS — R5383 Other fatigue: Secondary | ICD-10-CM | POA: Insufficient documentation

## 2020-04-12 DIAGNOSIS — R002 Palpitations: Secondary | ICD-10-CM | POA: Insufficient documentation

## 2021-08-28 ENCOUNTER — Other Ambulatory Visit: Payer: Self-pay | Admitting: Family Medicine

## 2021-08-28 DIAGNOSIS — R221 Localized swelling, mass and lump, neck: Secondary | ICD-10-CM

## 2021-09-04 ENCOUNTER — Ambulatory Visit
Admission: RE | Admit: 2021-09-04 | Discharge: 2021-09-04 | Disposition: A | Discharge status: Home / Self Care | Payer: 59 | Source: Ambulatory Visit | Attending: Family Medicine | Admitting: Family Medicine

## 2021-09-04 DIAGNOSIS — R221 Localized swelling, mass and lump, neck: Secondary | ICD-10-CM | POA: Diagnosis not present

## 2023-02-13 DIAGNOSIS — E6609 Other obesity due to excess calories: Secondary | ICD-10-CM | POA: Insufficient documentation

## 2024-01-14 ENCOUNTER — Ambulatory Visit

## 2024-01-14 DIAGNOSIS — Z860101 Personal history of adenomatous and serrated colon polyps: Secondary | ICD-10-CM | POA: Diagnosis not present

## 2024-01-14 DIAGNOSIS — Z09 Encounter for follow-up examination after completed treatment for conditions other than malignant neoplasm: Secondary | ICD-10-CM | POA: Diagnosis present

## 2024-01-14 DIAGNOSIS — K640 First degree hemorrhoids: Secondary | ICD-10-CM | POA: Diagnosis not present

## 2024-01-14 DIAGNOSIS — D123 Benign neoplasm of transverse colon: Secondary | ICD-10-CM | POA: Diagnosis not present

## 2024-01-14 DIAGNOSIS — D124 Benign neoplasm of descending colon: Secondary | ICD-10-CM | POA: Diagnosis not present

## 2024-02-04 ENCOUNTER — Ambulatory Visit: Admitting: Podiatry

## 2024-02-04 ENCOUNTER — Ambulatory Visit

## 2024-02-04 DIAGNOSIS — M205X2 Other deformities of toe(s) (acquired), left foot: Secondary | ICD-10-CM

## 2024-02-04 DIAGNOSIS — M722 Plantar fascial fibromatosis: Secondary | ICD-10-CM

## 2024-02-04 MED ORDER — METHYLPREDNISOLONE 4 MG PO TBPK
ORAL_TABLET | ORAL | 0 refills | Status: AC
Start: 2024-02-04 — End: ?

## 2024-02-04 MED ORDER — MELOXICAM 15 MG PO TABS: 15.0000 mg | ORAL_TABLET | Freq: Every day | ORAL | 0 refills | Status: AC

## 2024-02-04 MED ORDER — TRIAMCINOLONE ACETONIDE 40 MG/ML IJ SUSP
20.0000 mg | Freq: Once | INTRAMUSCULAR | Status: AC
  Administered 2024-02-04: 20 mg

## 2024-02-04 NOTE — Progress Notes (Signed)
 Subjective:  Patient ID: Trevor Jacobson., male    DOB: [birth year],  MRN: [id number] HPI Chief Complaint  Patient presents with   Foot Pain    New pt- left heel pain    58 y.o. male presents with the above complaint.   ROS: Denies fever chills nausea mobic muscle aches pains calf pain back pain chest pain shortness of breath.  Past Medical History:  Diagnosis Date   Colon polyps    Diverticulosis    Dyspnea    Hemorrhoids    History of kidney stones    Obese    Sleep apnea    Past Surgical History:  Procedure Laterality Date   COLONOSCOPY WITH PROPOFOL  N/A 01/12/2016   Procedure: COLONOSCOPY WITH PROPOFOL ;  Surgeon: Gladis RAYMOND Mariner, MD;  Location: [facility] ENDOSCOPY;  Service: Endoscopy;  Laterality: N/A;   COLONOSCOPY WITH PROPOFOL  N/A 01/18/2019   Procedure: COLONOSCOPY WITH PROPOFOL ;  Surgeon: Mariner Gladis RAYMOND, MD;  Location: [facility] ENDOSCOPY;  Service: Endoscopy;  Laterality: N/A;   FRACTURE SURGERY     OPEN,TYPE III,LOWER END OF TIBIA    Current Outpatient Medications:    meloxicam (MOBIC) 15 MG tablet, Take 1 tablet (15 mg total) by mouth daily., Disp: 30 tablet, Rfl: 0   methylPREDNISolone (MEDROL DOSEPAK) 4 MG TBPK tablet, Take 6 tabs the 1st day, take 5 tabs the 2nd day, take 4 tabs the 3rd day, take 3 tabs the 4th day, take 2 tabs the 5th day, and take 1 tab the 6th day, Disp: 21 tablet, Rfl: 0   Na Sulfate-K Sulfate-Mg Sulfate concentrate (SUPREP) 17.5-3.13-1.6 GM/177ML SOLN, Take 1 Bottle by mouth., Disp: , Rfl:    semaglutide-weight management (WEGOVY) 1 MG/0.5ML SOAJ SQ injection, Inject 1 mg into the skin., Disp: , Rfl:    ZEPBOUND 10 MG/0.5ML Pen, SMARTSIG:0.5 Milliliter(s) SUB-Q Once a Week, Disp: , Rfl:    omeprazole (PRILOSEC) 20 MG capsule, Take 20 mg by mouth daily., Disp: , Rfl:   No Known Allergies Review of Systems Objective:  There were no vitals filed for this visit.  General: Well developed, nourished, in no acute distress, alert and oriented  x3   Dermatological: Skin is warm, dry and supple bilateral. Nails x 10 are well maintained; remaining integument appears unremarkable at this time. There are no open sores, no preulcerative lesions, no rash or signs of infection present.  Vascular: Dorsalis Pedis artery and Posterior Tibial artery pedal pulses are 2/4 bilateral with immedate capillary fill time. Pedal hair growth present. No varicosities and no lower extremity edema present bilateral.   Neruologic: Grossly intact via light touch bilateral. Vibratory intact via tuning fork bilateral. Protective threshold with Semmes Wienstein monofilament intact to all pedal sites bilateral. Patellar and Achilles deep tendon reflexes 2+ bilateral. No Babinski or clonus noted bilateral.   Musculoskeletal: No gross boney pedal deformities bilateral. No pain, crepitus, or limitation noted with foot and ankle range of motion bilateral. Muscular strength 5/5 in all groups tested bilateral.  He has pain on palpation medial calcaneal tubercle of his left heel.  Limited range of motion of the first metatarsal phalangeal joint of the left foot secondary to spurring which is palpable dorsally.  Gait: Unassisted, Nonantalgic.    Radiographs:  Radiographs taken today demonstrate osseously mature individual with good bone mineralization.  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  Plantar distally oriented calcaneal heel spur with soft tissue increase in density at the plantar fascial calcaneal insertion site.  Assessment & Plan:   Assessment: Plantar fasciitis left.  Hallux limitus first metatarsophalangeal joint left.    Plan: Discussed etiology pathology conservative surgical therapies I injected the left heel today 20 mg Kenalog 5 mg Marcaine to the point of maximal tenderness.  Started him on methylprednisolone to be followed by meloxicam discussed appropriate shoe gear and placed him in a plantar fascial  brace.  I would like to follow-up with him in 1 month.     Trevor Jacobson, [place]

## 2024-03-24 ENCOUNTER — Ambulatory Visit: Admitting: Podiatry
